# Patient Record
Sex: Male | Born: 1990 | Race: Black or African American | Hispanic: No | Marital: Married | Smoking: Never smoker
Health system: Southern US, Community
[De-identification: ages and names within clinical notes are randomized; demographics above are authoritative.]

---

## 2019-04-08 ENCOUNTER — Other Ambulatory Visit: Payer: Self-pay

## 2019-04-08 ENCOUNTER — Emergency Department (HOSPITAL_COMMUNITY)
Admission: EM | Admit: 2019-04-08 | Discharge: 2019-04-09 | Disposition: A | Discharge status: Home / Self Care | Payer: BC Managed Care – PPO | Attending: Emergency Medicine | Admitting: Emergency Medicine

## 2019-04-08 DIAGNOSIS — S5001XA Contusion of right elbow, initial encounter: Secondary | ICD-10-CM | POA: Insufficient documentation

## 2019-04-08 DIAGNOSIS — Y999 Unspecified external cause status: Secondary | ICD-10-CM | POA: Diagnosis not present

## 2019-04-08 DIAGNOSIS — S59901A Unspecified injury of right elbow, initial encounter: Secondary | ICD-10-CM | POA: Diagnosis not present

## 2019-04-08 DIAGNOSIS — Y9355 Activity, bike riding: Secondary | ICD-10-CM | POA: Insufficient documentation

## 2019-04-08 DIAGNOSIS — Y929 Unspecified place or not applicable: Secondary | ICD-10-CM | POA: Insufficient documentation

## 2019-04-08 DIAGNOSIS — M7989 Other specified soft tissue disorders: Secondary | ICD-10-CM | POA: Diagnosis not present

## 2019-04-08 DIAGNOSIS — M25521 Pain in right elbow: Secondary | ICD-10-CM | POA: Diagnosis not present

## 2019-04-09 ENCOUNTER — Other Ambulatory Visit: Payer: Self-pay

## 2019-04-09 ENCOUNTER — Emergency Department (HOSPITAL_COMMUNITY): Payer: BC Managed Care – PPO

## 2019-04-09 ENCOUNTER — Encounter (HOSPITAL_COMMUNITY): Payer: Self-pay | Admitting: *Deleted

## 2019-04-09 DIAGNOSIS — S5001XA Contusion of right elbow, initial encounter: Secondary | ICD-10-CM | POA: Diagnosis not present

## 2019-04-09 DIAGNOSIS — M25521 Pain in right elbow: Secondary | ICD-10-CM | POA: Diagnosis not present

## 2019-04-09 DIAGNOSIS — M7989 Other specified soft tissue disorders: Secondary | ICD-10-CM | POA: Diagnosis not present

## 2019-04-09 NOTE — ED Notes (Signed)
Patient verbalizes understanding of discharge instructions. Opportunity for questioning and answers were provided. Armband removed by staff, pt discharged from ED ambulatory to home.  

## 2019-04-09 NOTE — Discharge Instructions (Signed)
Apply ice to areas of injury 3-4 times per day to limit inflammation.  Avoid strenuous activity and heavy lifting.  We recommend consistent use of naproxen as prescribed.  We recommend follow-up with a primary care doctor to ensure resolution of symptoms.  You may follow-up with orthopedics if pain persists, if desired.  Return to the ED for any new or concerning symptoms.
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
## 2020-05-17 ENCOUNTER — Emergency Department (HOSPITAL_BASED_OUTPATIENT_CLINIC_OR_DEPARTMENT_OTHER): Payer: BC Managed Care – PPO

## 2020-05-17 ENCOUNTER — Encounter (HOSPITAL_BASED_OUTPATIENT_CLINIC_OR_DEPARTMENT_OTHER): Payer: Self-pay | Admitting: Emergency Medicine

## 2020-05-17 ENCOUNTER — Other Ambulatory Visit: Payer: Self-pay

## 2020-05-17 ENCOUNTER — Emergency Department (HOSPITAL_BASED_OUTPATIENT_CLINIC_OR_DEPARTMENT_OTHER)
Admission: EM | Admit: 2020-05-17 | Discharge: 2020-05-17 | Disposition: A | Discharge status: Home / Self Care | Payer: BC Managed Care – PPO | Attending: Emergency Medicine | Admitting: Emergency Medicine

## 2020-05-17 DIAGNOSIS — R079 Chest pain, unspecified: Secondary | ICD-10-CM | POA: Diagnosis not present

## 2020-05-17 DIAGNOSIS — M79645 Pain in left finger(s): Secondary | ICD-10-CM | POA: Diagnosis not present

## 2020-05-17 DIAGNOSIS — S01411A Laceration without foreign body of right cheek and temporomandibular area, initial encounter: Secondary | ICD-10-CM | POA: Insufficient documentation

## 2020-05-17 DIAGNOSIS — Z23 Encounter for immunization: Secondary | ICD-10-CM | POA: Insufficient documentation

## 2020-05-17 DIAGNOSIS — S0091XA Abrasion of unspecified part of head, initial encounter: Secondary | ICD-10-CM | POA: Diagnosis not present

## 2020-05-17 DIAGNOSIS — S40812A Abrasion of left upper arm, initial encounter: Secondary | ICD-10-CM | POA: Diagnosis not present

## 2020-05-17 DIAGNOSIS — S0990XA Unspecified injury of head, initial encounter: Secondary | ICD-10-CM | POA: Diagnosis not present

## 2020-05-17 DIAGNOSIS — S0181XA Laceration without foreign body of other part of head, initial encounter: Secondary | ICD-10-CM

## 2020-05-17 DIAGNOSIS — Z043 Encounter for examination and observation following other accident: Secondary | ICD-10-CM | POA: Diagnosis not present

## 2020-05-17 DIAGNOSIS — S40811A Abrasion of right upper arm, initial encounter: Secondary | ICD-10-CM | POA: Insufficient documentation

## 2020-05-17 DIAGNOSIS — S0081XA Abrasion of other part of head, initial encounter: Secondary | ICD-10-CM | POA: Diagnosis not present

## 2020-05-17 MED ORDER — CYCLOBENZAPRINE HCL 10 MG PO TABS
10.0000 mg | ORAL_TABLET | Freq: Once | ORAL | Status: AC
  Administered 2020-05-17: 10 mg via ORAL
  Filled 2020-05-17: qty 1

## 2020-05-17 MED ORDER — CYCLOBENZAPRINE HCL 10 MG PO TABS: 10.0000 mg | ORAL_TABLET | Freq: Three times a day (TID) | ORAL | 0 refills | Status: AC | PRN

## 2020-05-17 MED ORDER — ACETAMINOPHEN 500 MG PO TABS
1000.0000 mg | ORAL_TABLET | Freq: Once | ORAL | Status: AC
  Administered 2020-05-17: 1000 mg via ORAL
  Filled 2020-05-17: qty 2

## 2020-05-17 MED ORDER — TETANUS-DIPHTH-ACELL PERTUSSIS 5-2.5-18.5 LF-MCG/0.5 IM SUSY
0.5000 mL | PREFILLED_SYRINGE | Freq: Once | INTRAMUSCULAR | Status: AC
  Administered 2020-05-17: 0.5 mL via INTRAMUSCULAR
  Filled 2020-05-17: qty 0.5

## 2020-05-17 MED ORDER — LIDOCAINE HCL (PF) 1 % IJ SOLN
5.0000 mL | Freq: Once | INTRAMUSCULAR | Status: AC
  Administered 2020-05-17: 5 mL via INTRADERMAL
  Filled 2020-05-17: qty 5

## 2020-05-17 MED ORDER — NAPROXEN 500 MG PO TABS: 500.0000 mg | ORAL_TABLET | Freq: Two times a day (BID) | ORAL | 0 refills | Status: AC
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
## 2020-05-17 NOTE — ED Notes (Signed)
Pt given crackers and juice
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
## 2020-05-17 NOTE — Discharge Instructions (Signed)
I have placed 2 sutures to your right cheek, please have them removed within the next 5 to 7 days.  Please keep the wound away from the sun, you may apply bacitracin.   I have prescribed a short course of anti-inflammatories to help with the swelling, please take 1 tablet twice a day for the next 7 days.  In addition, you are prescribed muscle relaxers, please be aware this medication can make you drowsy.  You may take 1 tablet up to 3 times a day to help with muscle spasms.  You may also apply ice, with clean wounds while at home.

## 2020-05-23 DIAGNOSIS — S0081XA Abrasion of other part of head, initial encounter: Secondary | ICD-10-CM | POA: Diagnosis not present

## 2020-05-23 DIAGNOSIS — Z4802 Encounter for removal of sutures: Secondary | ICD-10-CM | POA: Diagnosis not present

## 2020-05-30 ENCOUNTER — Ambulatory Visit (HOSPITAL_BASED_OUTPATIENT_CLINIC_OR_DEPARTMENT_OTHER): Payer: BC Managed Care – PPO | Admitting: Family Medicine

## 2020-06-11 ENCOUNTER — Encounter (HOSPITAL_BASED_OUTPATIENT_CLINIC_OR_DEPARTMENT_OTHER): Payer: Self-pay | Admitting: Family Medicine

## 2020-06-11 ENCOUNTER — Other Ambulatory Visit: Payer: Self-pay

## 2020-06-11 ENCOUNTER — Ambulatory Visit (INDEPENDENT_AMBULATORY_CARE_PROVIDER_SITE_OTHER): Payer: BC Managed Care – PPO | Admitting: Family Medicine

## 2020-06-11 VITALS — BP 126/84 | HR 61 | Ht 69.0 in | Wt 193.2 lb

## 2020-06-11 DIAGNOSIS — Z6828 Body mass index (BMI) 28.0-28.9, adult: Secondary | ICD-10-CM | POA: Diagnosis not present

## 2020-06-11 DIAGNOSIS — Z13220 Encounter for screening for lipoid disorders: Secondary | ICD-10-CM

## 2020-06-11 NOTE — Progress Notes (Signed)
   New Patient Office Visit  Subjective:  Patient ID: Jeff Lawson, male    DOB: 06-18-1990  Age: 30 y.o. MRN: [id number]  CC:  Chief Complaint  Patient presents with  . Establish Care    Est Care - No prior Pcp    HPI Jeff Lawson is a 30 year old male presenting to establish in clinic.  He denies any acute concerns today.  Denies any significant past medical history.  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  He is also relatively active, particularly with cycling.  Indicates that he has not had any primary care provider locally.  He originally moved to the area from [place] for school and has remained here.  Denies any prior issues leading to hospitalization or surgery in the past.  History reviewed. No pertinent past medical history.  History reviewed. No pertinent surgical history.  Family History  Problem Relation Age of Onset  . Arthritis Mother   . Dementia Maternal Grandmother   . Diabetes Maternal Grandfather     Social History   Socioeconomic History  . Marital status: Married    Spouse name: Not on file  . Number of children: Not on file  . Years of education: Not on file  . Highest education level: Not on file  Occupational History  . Not on file  Tobacco Use  . Smoking status: Never Smoker  . Smokeless tobacco: Never Used  Vaping Use  . Vaping Use: Never used  Substance and Sexual Activity  . Alcohol use: Never  . Drug use: Never  . Sexual activity: Not Currently  Other Topics Concern  . Not on file  Social History Narrative  . Not on file   Social Determinants of Health   Financial Resource Strain: Not on file  Food Insecurity: Not on file  Transportation Needs: Not on file  Physical Activity: Not on file  Stress: Not on file  Social Connections: Not on file  Intimate Partner Violence: Not on file    Objective:   Today's Vitals: BP 126/84   Pulse 61   Ht 5\' 9"   (1.753 m)   Wt 193 lb 3.2 oz (87.6 kg)   SpO2 98%   BMI 28.53 kg/m   Physical Exam  Pleasant 30 year old male in no acute distress Cardiovascular exam with regular rate and rhythm, no murmurs appreciated Lungs clear to auscultation bilaterally  Assessment & Plan:   Problem List Items Addressed This Visit      Other   BMI 28.0-28.9,adult    Patient is generally active, particularly engages in regular cycling Encouraged to continue with regular physical activity, aiming for at least 250 minutes of moderate intensity aerobic activity per week Can continue to monitor weight in the future Check labs as below      Relevant Orders   CBC with Differential/Platelet   Comprehensive metabolic panel    Other Visit Diagnoses    Screening for cholesterol level    -  Primary   Relevant Orders   CBC with Differential/Platelet   Comprehensive metabolic panel   Lipid panel      No outpatient encounter medications on file as of 06/11/2020.   No facility-administered encounter medications on file as of 06/11/2020.    Follow-up: Return in about 2 months (around 08/11/2020) for Follow Up.  Plan to complete CPE at next visit.  Asser Lucena J De 10/11/2020, MD

## 2020-06-11 NOTE — Patient Instructions (Signed)
  Medication Instructions:  Your physician recommends that you continue on your current medications as directed. Please refer to the Current Medication list given to you today. --If you need a refill on any your medications before your next appointment, please call your pharmacy first. If no refills are authorized on file call the office.--  Lab Work: Your physician has recommended that you have lab work today: CBC, CMP, and Lipid Profile If you have labs (blood work) drawn today and your tests are completely normal, you will receive your results only by: Marland Kitchen MyChart Message (if you have MyChart) OR . A phone call from our staff. Please ensure you check your voicemail in the event that you authorized detailed messages to be left on a delegated number. If you have any lab test that is abnormal or we need to change your treatment, we will call you to review the results.  Follow-Up: Your next appointment:   Your physician recommends that you schedule a follow-up appointment in: 2 MONTHS with Dr. de Peru  Thanks for letting us be apart of your health journey!!  Primary Care and Sports Medicine   Dr. de Peru and Shawna Clamp, DNP, AGNP  We recommend signing up for the patient portal called "MyChart".  Sign up information is provided on this After Visit Summary.  MyChart is used to connect with patients for Virtual Visits (Telemedicine).  Patients are able to view lab/test results, encounter notes, upcoming appointments, etc.  Non-urgent messages can be sent to your provider as well.   To learn more about what you can do with MyChart, please visit --  [email or web address].

## 2020-06-11 NOTE — Assessment & Plan Note (Signed)
Patient is generally active, particularly engages in regular cycling Encouraged to continue with regular physical activity, aiming for at least 250 minutes of moderate intensity aerobic activity per week Can continue to monitor weight in the future Check labs as below

## 2020-06-12 LAB — COMPREHENSIVE METABOLIC PANEL
ALT: 20 IU/L (ref 0–44)
AST: 28 IU/L (ref 0–40)
Albumin/Globulin Ratio: 1.7 (ref 1.2–2.2)
Albumin: 4.8 g/dL (ref 4.0–5.0)
Alkaline Phosphatase: 81 IU/L (ref 44–121)
BUN/Creatinine Ratio: 13 (ref 9–20)
BUN: 14 mg/dL (ref 6–20)
Bilirubin Total: 1.7 mg/dL — ABNORMAL HIGH (ref 0.0–1.2)
CO2: 23 mmol/L (ref 20–29)
Calcium: 9.6 mg/dL (ref 8.7–10.2)
Chloride: 98 mmol/L (ref 96–106)
Creatinine, Ser: 1.07 mg/dL (ref 0.76–1.27)
Globulin, Total: 2.8 g/dL (ref 1.5–4.5)
Glucose: 73 mg/dL (ref 65–99)
Potassium: 4.1 mmol/L (ref 3.5–5.2)
Sodium: 138 mmol/L (ref 134–144)
Total Protein: 7.6 g/dL (ref 6.0–8.5)
eGFR: 95 mL/min/{1.73_m2} (ref >59)

## 2020-06-12 LAB — CBC WITH DIFFERENTIAL/PLATELET
Basophils Absolute: 0.1 10*3/uL (ref 0.0–0.2)
Basos: 1 %
EOS (ABSOLUTE): 0.4 10*3/uL (ref 0.0–0.4)
Eos: 6 %
Hematocrit: 40.7 % (ref 37.5–51.0)
Hemoglobin: 13.8 g/dL (ref 13.0–17.7)
Immature Grans (Abs): 0 10*3/uL (ref 0.0–0.1)
Immature Granulocytes: 0 %
Lymphocytes Absolute: 3.4 10*3/uL — ABNORMAL HIGH (ref 0.7–3.1)
Lymphs: 49 %
MCH: 26.8 pg (ref 26.6–33.0)
MCHC: 33.9 g/dL (ref 31.5–35.7)
MCV: 79 fL (ref 79–97)
Monocytes Absolute: 0.8 10*3/uL (ref 0.1–0.9)
Monocytes: 11 %
Neutrophils Absolute: 2.3 10*3/uL (ref 1.4–7.0)
Neutrophils: 33 %
Platelets: 273 10*3/uL (ref 150–450)
RBC: 5.15 x10E6/uL (ref 4.14–5.80)
RDW: 13 % (ref 11.6–15.4)
WBC: 6.9 10*3/uL (ref 3.4–10.8)

## 2020-06-12 LAB — LIPID PANEL
Chol/HDL Ratio: 3.1 ratio (ref 0.0–5.0)
Cholesterol, Total: 169 mg/dL (ref 100–199)
HDL: 55 mg/dL (ref >39)
LDL Chol Calc (NIH): 98 mg/dL (ref 0–99)
Triglycerides: 86 mg/dL (ref 0–149)
VLDL Cholesterol Cal: 16 mg/dL (ref 5–40)

## 2020-06-17 ENCOUNTER — Telehealth (HOSPITAL_BASED_OUTPATIENT_CLINIC_OR_DEPARTMENT_OTHER): Payer: Self-pay

## 2020-06-17 NOTE — Telephone Encounter (Signed)
Results released by Dr. de [place] and reviewed by patient via MyChart Instructed patient to contact the office with any questions or concerns.  

## 2020-06-17 NOTE — Telephone Encounter (Signed)
-----   Message from Hosie Poisson Peru, MD sent at 06/16/2020  4:59 PM EDT ----- Cliffton Asters blood cell and red blood cell counts are normal with normal hemoglobin.  Electrolytes, kidney function and liver function are normal.  Lipid panel is good with normal cholesterol values.

## 2020-07-17 ENCOUNTER — Emergency Department (HOSPITAL_COMMUNITY): Payer: BC Managed Care – PPO

## 2020-07-17 ENCOUNTER — Other Ambulatory Visit: Payer: Self-pay

## 2020-07-17 ENCOUNTER — Emergency Department (HOSPITAL_COMMUNITY)
Admission: EM | Admit: 2020-07-17 | Discharge: 2020-07-17 | Disposition: A | Discharge status: Home / Self Care | Payer: BC Managed Care – PPO | Attending: Emergency Medicine | Admitting: Emergency Medicine

## 2020-07-17 DIAGNOSIS — S8011XA Contusion of right lower leg, initial encounter: Secondary | ICD-10-CM | POA: Diagnosis not present

## 2020-07-17 DIAGNOSIS — S3991XA Unspecified injury of abdomen, initial encounter: Secondary | ICD-10-CM | POA: Diagnosis not present

## 2020-07-17 DIAGNOSIS — S199XXA Unspecified injury of neck, initial encounter: Secondary | ICD-10-CM | POA: Diagnosis not present

## 2020-07-17 DIAGNOSIS — T148XXA Other injury of unspecified body region, initial encounter: Secondary | ICD-10-CM

## 2020-07-17 DIAGNOSIS — I959 Hypotension, unspecified: Secondary | ICD-10-CM | POA: Diagnosis not present

## 2020-07-17 DIAGNOSIS — Z041 Encounter for examination and observation following transport accident: Secondary | ICD-10-CM | POA: Diagnosis not present

## 2020-07-17 DIAGNOSIS — R0902 Hypoxemia: Secondary | ICD-10-CM | POA: Diagnosis not present

## 2020-07-17 DIAGNOSIS — S0990XA Unspecified injury of head, initial encounter: Secondary | ICD-10-CM | POA: Diagnosis not present

## 2020-07-17 DIAGNOSIS — R609 Edema, unspecified: Secondary | ICD-10-CM

## 2020-07-17 DIAGNOSIS — S8991XA Unspecified injury of right lower leg, initial encounter: Secondary | ICD-10-CM | POA: Diagnosis not present

## 2020-07-17 DIAGNOSIS — K760 Fatty (change of) liver, not elsewhere classified: Secondary | ICD-10-CM | POA: Diagnosis not present

## 2020-07-17 DIAGNOSIS — S79921A Unspecified injury of right thigh, initial encounter: Secondary | ICD-10-CM | POA: Diagnosis not present

## 2020-07-17 DIAGNOSIS — Y92410 Unspecified street and highway as the place of occurrence of the external cause: Secondary | ICD-10-CM | POA: Diagnosis not present

## 2020-07-17 DIAGNOSIS — R42 Dizziness and giddiness: Secondary | ICD-10-CM | POA: Diagnosis not present

## 2020-07-17 DIAGNOSIS — N281 Cyst of kidney, acquired: Secondary | ICD-10-CM | POA: Diagnosis not present

## 2020-07-17 DIAGNOSIS — S79929A Unspecified injury of unspecified thigh, initial encounter: Secondary | ICD-10-CM | POA: Diagnosis not present

## 2020-07-17 DIAGNOSIS — T1490XA Injury, unspecified, initial encounter: Secondary | ICD-10-CM

## 2020-07-17 DIAGNOSIS — S7011XA Contusion of right thigh, initial encounter: Secondary | ICD-10-CM | POA: Diagnosis not present

## 2020-07-17 DIAGNOSIS — S299XXA Unspecified injury of thorax, initial encounter: Secondary | ICD-10-CM | POA: Diagnosis not present

## 2020-07-17 DIAGNOSIS — M79604 Pain in right leg: Secondary | ICD-10-CM | POA: Diagnosis not present

## 2020-07-17 LAB — CBC WITH DIFFERENTIAL/PLATELET
Abs Immature Granulocytes: 0.11 10*3/uL — ABNORMAL HIGH (ref 0.00–0.07)
Basophils Absolute: 0.1 10*3/uL (ref 0.0–0.1)
Basophils Relative: 0 %
Eosinophils Absolute: 0.1 10*3/uL (ref 0.0–0.5)
Eosinophils Relative: 1 %
HCT: 43.7 % (ref 39.0–52.0)
Hemoglobin: 13.8 g/dL (ref 13.0–17.0)
Immature Granulocytes: 1 %
Lymphocytes Relative: 19 %
Lymphs Abs: 4.1 10*3/uL — ABNORMAL HIGH (ref 0.7–4.0)
MCH: 26.9 pg (ref 26.0–34.0)
MCHC: 31.6 g/dL (ref 30.0–36.0)
MCV: 85.2 fL (ref 80.0–100.0)
Monocytes Absolute: 1.2 10*3/uL — ABNORMAL HIGH (ref 0.1–1.0)
Monocytes Relative: 6 %
Neutro Abs: 16.2 10*3/uL — ABNORMAL HIGH (ref 1.7–7.7)
Neutrophils Relative %: 73 %
Platelets: 269 10*3/uL (ref 150–400)
RBC: 5.13 MIL/uL (ref 4.22–5.81)
RDW: 13 % (ref 11.5–15.5)
WBC: 21.7 10*3/uL — ABNORMAL HIGH (ref 4.0–10.5)
nRBC: 0 % (ref 0.0–0.2)

## 2020-07-17 LAB — COMPREHENSIVE METABOLIC PANEL
ALT: 28 U/L (ref 0–44)
AST: 48 U/L — ABNORMAL HIGH (ref 15–41)
Albumin: 4.1 g/dL (ref 3.5–5.0)
Alkaline Phosphatase: 62 U/L (ref 38–126)
Anion gap: 13 (ref 5–15)
BUN: 19 mg/dL (ref 6–20)
CO2: 23 mmol/L (ref 22–32)
Calcium: 9.4 mg/dL (ref 8.9–10.3)
Chloride: 103 mmol/L (ref 98–111)
Creatinine, Ser: 1.72 mg/dL — ABNORMAL HIGH (ref 0.61–1.24)
GFR, Estimated: 53 mL/min — ABNORMAL LOW (ref >60)
Glucose, Bld: 175 mg/dL — ABNORMAL HIGH (ref 70–99)
Potassium: 4.1 mmol/L (ref 3.5–5.1)
Sodium: 139 mmol/L (ref 135–145)
Total Bilirubin: 1.7 mg/dL — ABNORMAL HIGH (ref 0.3–1.2)
Total Protein: 6.9 g/dL (ref 6.5–8.1)

## 2020-07-17 LAB — TYPE AND SCREEN
ABO/RH(D): O POS
Antibody Screen: NEGATIVE

## 2020-07-17 MED ORDER — FENTANYL CITRATE (PF) 100 MCG/2ML IJ SOLN
100.0000 ug | Freq: Once | INTRAMUSCULAR | Status: AC
  Administered 2020-07-17: 100 ug via INTRAVENOUS
  Filled 2020-07-17: qty 2

## 2020-07-17 MED ORDER — IOHEXOL 350 MG/ML SOLN
50.0000 mL | Freq: Once | INTRAVENOUS | Status: AC | PRN
  Administered 2020-07-17: 50 mL via INTRAVENOUS

## 2020-07-17 MED ORDER — FENTANYL CITRATE (PF) 100 MCG/2ML IJ SOLN
100.0000 ug | Freq: Once | INTRAMUSCULAR | Status: AC
  Administered 2020-07-17: 100 ug via INTRAVENOUS

## 2020-07-17 MED ORDER — SODIUM CHLORIDE 0.9 % IV BOLUS
1000.0000 mL | Freq: Once | INTRAVENOUS | Status: AC
  Administered 2020-07-17: 1000 mL via INTRAVENOUS

## 2020-07-17 MED ORDER — IOHEXOL 300 MG/ML  SOLN
50.0000 mL | Freq: Once | INTRAMUSCULAR | Status: AC | PRN
  Administered 2020-07-17: 50 mL via INTRAVENOUS

## 2020-07-17 MED ORDER — NAPROXEN 500 MG PO TABS: 500.0000 mg | ORAL_TABLET | Freq: Two times a day (BID) | ORAL | 0 refills | Status: DC

## 2020-07-17 NOTE — ED Notes (Signed)
Patient discharge paperwork reviewed with the patient. The patient verbalized understanding of instructions. Patient discharged. 
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
## 2020-07-17 NOTE — ED Notes (Signed)
C-collar removed by EDP.   
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
## 2020-07-17 NOTE — ED Notes (Signed)
Patient BP dropped into 70s while in CT, RN and TRN @ bedside, EDP made aware, initiated NS bolus.

## 2020-07-17 NOTE — ED Notes (Signed)
Patient transported to CT 

## 2020-07-17 NOTE — Discharge Instructions (Addendum)
Thankfully your x-rays and CAT scans do not show any signs of serious internal injury however they do show that you have some significant bruising and bleeding within the muscles of your right thigh.  This should gradually get better over the next month however I would avoid any intense exercise until you are significantly improved and pain-free.  At this time I would recommend bed rest for 24 hours, ice, anti-inflammatories as I have prescribed  Please take Naprosyn, 500mg  by mouth twice daily as needed for pain - this in an antiinflammatory medicine (NSAID) and is similar to ibuprofen - many people feel that it is stronger than ibuprofen and it is easier to take since it is a smaller pill.  Please use this only for 1 week - if your pain persists, you will need to follow up with your doctor in the office for ongoing guidance and pain control.    Once you have become significantly improved please start to walk and gently exercise, you may return to full exercise once your pain has subsided.  Please return to the emergency department for numbness weakness or severe worsening symptoms.  You are likely anemic because you have lost some blood into your leg, able to take you a couple of weeks to make this back so please hydrate aggressively.  Thank you for letting take care of you today!  Please obtain all of your results from medical records or have your doctors office obtain the results - share them with your doctor - you should be seen at your doctors office in the next 2 days. Call today to arrange your follow up. Take the medications as prescribed. Please review all of the medicines and only take them if you do not have an allergy to them. Please be aware that if you are taking birth control pills, taking other prescriptions, ESPECIALLY ANTIBIOTICS may make the birth control ineffective - if this is the case, either do not engage in sexual activity or use alternative methods of birth control such as  condoms until you have finished the medicine and your family doctor says it is OK to restart them. If you are on a blood thinner such as COUMADIN, be aware that any other medicine that you take may cause the coumadin to either work too much, or not enough - you should have your coumadin level rechecked in next 7 days if this is the case.  ?  It is also a possibility that you have an allergic reaction to any of the medicines that you have been prescribed - Everybody reacts differently to medications and while MOST people have no trouble with most medicines, you may have a reaction such as nausea, vomiting, rash, swelling, shortness of breath. If this is the case, please stop taking the medicine immediately and contact your physician.   If you were given a medication in the ED such as percocet, vicodin, or morphine, be aware that these medicines are sedating and may change your ability to take care of yourself adequately for several hours after being given this medicines - you should not drive or take care of small children if you were given this medicine in the Emergency Department or if you have been prescribed these types of medicines. ?   You should return to the ER IMMEDIATELY if you develop severe or worsening symptoms.
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
## 2020-08-06 ENCOUNTER — Telehealth (HOSPITAL_BASED_OUTPATIENT_CLINIC_OR_DEPARTMENT_OTHER): Payer: Self-pay | Admitting: Family Medicine

## 2020-08-06 NOTE — Telephone Encounter (Signed)
Responded to MyChart message - Called Pt to confirm 9/6 CPE appt for 2:30p

## 2020-08-11 ENCOUNTER — Encounter (HOSPITAL_BASED_OUTPATIENT_CLINIC_OR_DEPARTMENT_OTHER): Payer: BC Managed Care – PPO | Admitting: Family Medicine

## 2020-09-09 ENCOUNTER — Ambulatory Visit (HOSPITAL_BASED_OUTPATIENT_CLINIC_OR_DEPARTMENT_OTHER): Payer: BC Managed Care – PPO | Admitting: Family Medicine

## 2020-09-09 ENCOUNTER — Ambulatory Visit (INDEPENDENT_AMBULATORY_CARE_PROVIDER_SITE_OTHER): Payer: BC Managed Care – PPO | Admitting: Family Medicine

## 2020-09-09 ENCOUNTER — Other Ambulatory Visit: Payer: Self-pay

## 2020-09-09 ENCOUNTER — Encounter (HOSPITAL_BASED_OUTPATIENT_CLINIC_OR_DEPARTMENT_OTHER): Payer: Self-pay | Admitting: Family Medicine

## 2020-09-09 DIAGNOSIS — R7989 Other specified abnormal findings of blood chemistry: Secondary | ICD-10-CM | POA: Insufficient documentation

## 2020-09-09 DIAGNOSIS — D72829 Elevated white blood cell count, unspecified: Secondary | ICD-10-CM | POA: Insufficient documentation

## 2020-09-09 DIAGNOSIS — Z0000 Encounter for general adult medical examination without abnormal findings: Secondary | ICD-10-CM

## 2020-09-09 DIAGNOSIS — R7401 Elevation of levels of liver transaminase levels: Secondary | ICD-10-CM | POA: Diagnosis not present

## 2020-09-09 NOTE — Assessment & Plan Note (Signed)
Overall, well-appearing male Discussed general screening recommendations, thinks that he might of had HIV and hepatitis C testing in the past in regards to screening, declines any screening related to this today Encouraged to continue with regular physical activity Utilize sun protection when outdoors

## 2020-09-09 NOTE — Assessment & Plan Note (Signed)
Repeat labs today to assess status

## 2020-09-09 NOTE — Assessment & Plan Note (Signed)
Repeat labs today to assess status 
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
## 2020-09-09 NOTE — Patient Instructions (Signed)
  Medication Instructions:  Your physician recommends that you continue on your current medications as directed. Please refer to the Current Medication list given to you today. --If you need a refill on any your medications before your next appointment, please call your pharmacy first. If no refills are authorized on file call the office.--  Lab Work: Your physician has recommended that you have lab work today: CBC and CMP If you have labs (blood work) drawn today and your tests are completely normal, you will receive your results via MyChart message OR a phone call from our staff.  Please ensure you check your voicemail in the event that you authorized detailed messages to be left on a delegated number. If you have any lab test that is abnormal or we need to change your treatment, we will call you to review the results.  Follow-Up: Your next appointment:   Your physician recommends that you schedule a follow-up appointment in: 1 YEAR with Dr. de [place]  Thanks for letting us be apart of your health journey!!  Primary Care and Sports Medicine   Dr. Ceasar Mons [place]   We encourage you to activate your patient portal called "MyChart".  Sign up information is provided on this After Visit Summary.  MyChart is used to connect with patients for Virtual Visits (Telemedicine).  Patients are able to view lab/test results, encounter notes, upcoming appointments, etc.  Non-urgent messages can be sent to your provider as well. To learn more about what you can do with MyChart, please visit --  [email or web address].

## 2020-09-10 LAB — CBC WITH DIFFERENTIAL/PLATELET
Basophils Absolute: 0.1 10*3/uL (ref 0.0–0.2)
Basos: 1 %
EOS (ABSOLUTE): 0.3 10*3/uL (ref 0.0–0.4)
Eos: 5 %
Hematocrit: 41.5 % (ref 37.5–51.0)
Hemoglobin: 13.7 g/dL (ref 13.0–17.7)
Immature Grans (Abs): 0 10*3/uL (ref 0.0–0.1)
Immature Granulocytes: 0 %
Lymphocytes Absolute: 3.1 10*3/uL (ref 0.7–3.1)
Lymphs: 48 %
MCH: 26.5 pg — ABNORMAL LOW (ref 26.6–33.0)
MCHC: 33 g/dL (ref 31.5–35.7)
MCV: 80 fL (ref 79–97)
Monocytes Absolute: 0.5 10*3/uL (ref 0.1–0.9)
Monocytes: 8 %
Neutrophils Absolute: 2.5 10*3/uL (ref 1.4–7.0)
Neutrophils: 38 %
Platelets: 270 10*3/uL (ref 150–450)
RBC: 5.17 x10E6/uL (ref 4.14–5.80)
RDW: 12.8 % (ref 11.6–15.4)
WBC: 6.5 10*3/uL (ref 3.4–10.8)

## 2020-09-10 LAB — COMPREHENSIVE METABOLIC PANEL
ALT: 17 IU/L (ref 0–44)
AST: 27 IU/L (ref 0–40)
Albumin/Globulin Ratio: 1.8 (ref 1.2–2.2)
Albumin: 4.8 g/dL (ref 4.0–5.0)
Alkaline Phosphatase: 83 IU/L (ref 44–121)
BUN/Creatinine Ratio: 9 (ref 9–20)
BUN: 10 mg/dL (ref 6–20)
Bilirubin Total: 0.9 mg/dL (ref 0.0–1.2)
CO2: 27 mmol/L (ref 20–29)
Calcium: 10 mg/dL (ref 8.7–10.2)
Chloride: 102 mmol/L (ref 96–106)
Creatinine, Ser: 1.06 mg/dL (ref 0.76–1.27)
Globulin, Total: 2.6 g/dL (ref 1.5–4.5)
Glucose: 84 mg/dL (ref 65–99)
Potassium: 4.5 mmol/L (ref 3.5–5.2)
Sodium: 140 mmol/L (ref 134–144)
Total Protein: 7.4 g/dL (ref 6.0–8.5)
eGFR: 96 mL/min/{1.73_m2} (ref >59)

## 2020-09-17 ENCOUNTER — Encounter (HOSPITAL_BASED_OUTPATIENT_CLINIC_OR_DEPARTMENT_OTHER): Payer: BC Managed Care – PPO | Admitting: Family Medicine
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
## 2023-08-02 ENCOUNTER — Encounter: Payer: Self-pay | Admitting: Family Medicine

## 2023-08-09 ENCOUNTER — Ambulatory Visit (INDEPENDENT_AMBULATORY_CARE_PROVIDER_SITE_OTHER)

## 2023-08-09 VITALS — BP 133/83 | HR 55 | Temp 98.3°F | Resp 16 | Ht 68.0 in | Wt 191.8 lb

## 2023-08-09 DIAGNOSIS — S43012D Anterior subluxation of left humerus, subsequent encounter: Secondary | ICD-10-CM

## 2023-08-09 DIAGNOSIS — Z114 Encounter for screening for human immunodeficiency virus [HIV]: Secondary | ICD-10-CM | POA: Diagnosis not present

## 2023-08-09 DIAGNOSIS — Z1159 Encounter for screening for other viral diseases: Secondary | ICD-10-CM

## 2023-08-09 DIAGNOSIS — Z7689 Persons encountering health services in other specified circumstances: Secondary | ICD-10-CM

## 2023-08-09 DIAGNOSIS — Z0000 Encounter for general adult medical examination without abnormal findings: Secondary | ICD-10-CM | POA: Diagnosis not present
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
## 2023-08-10 ENCOUNTER — Ambulatory Visit: Payer: Self-pay

## 2023-08-10 LAB — COMPREHENSIVE METABOLIC PANEL WITH GFR
ALT: 19 IU/L (ref 0–44)
AST: 27 IU/L (ref 0–40)
Albumin: 4.7 g/dL (ref 4.1–5.1)
Alkaline Phosphatase: 83 IU/L (ref 44–121)
BUN/Creatinine Ratio: 9 (ref 9–20)
BUN: 10 mg/dL (ref 6–20)
Bilirubin Total: 1.6 mg/dL — ABNORMAL HIGH (ref 0.0–1.2)
CO2: 22 mmol/L (ref 20–29)
Calcium: 9.5 mg/dL (ref 8.7–10.2)
Chloride: 103 mmol/L (ref 96–106)
Creatinine, Ser: 1.06 mg/dL (ref 0.76–1.27)
Globulin, Total: 2.5 g/dL (ref 1.5–4.5)
Glucose: 85 mg/dL (ref 70–99)
Potassium: 4.3 mmol/L (ref 3.5–5.2)
Sodium: 141 mmol/L (ref 134–144)
Total Protein: 7.2 g/dL (ref 6.0–8.5)
eGFR: 94 mL/min/1.73 (ref >59)

## 2023-08-10 LAB — CBC
Hematocrit: 41.9 % (ref 37.5–51.0)
Hemoglobin: 13.6 g/dL (ref 13.0–17.7)
MCH: 27.3 pg (ref 26.6–33.0)
MCHC: 32.5 g/dL (ref 31.5–35.7)
MCV: 84 fL (ref 79–97)
Platelets: 260 x10E3/uL (ref 150–450)
RBC: 4.99 x10E6/uL (ref 4.14–5.80)
RDW: 12.8 % (ref 11.6–15.4)
WBC: 5.5 x10E3/uL (ref 3.4–10.8)

## 2023-08-10 LAB — HEPATITIS C ANTIBODY: Hep C Virus Ab: NONREACTIVE

## 2023-08-10 LAB — HEMOGLOBIN A1C
Est. average glucose Bld gHb Est-mCnc: 94 mg/dL
Hgb A1c MFr Bld: 4.9 % (ref 4.8–5.6)

## 2023-08-10 LAB — LIPID PANEL
Chol/HDL Ratio: 2.9 ratio (ref 0.0–5.0)
Cholesterol, Total: 154 mg/dL (ref 100–199)
HDL: 53 mg/dL (ref >39)
LDL Chol Calc (NIH): 85 mg/dL (ref 0–99)
Triglycerides: 86 mg/dL (ref 0–149)
VLDL Cholesterol Cal: 16 mg/dL (ref 5–40)

## 2023-08-10 LAB — HIV ANTIBODY (ROUTINE TESTING W REFLEX): HIV Screen 4th Generation wRfx: NONREACTIVE

## 2023-08-10 NOTE — Progress Notes (Signed)
 Good news! All your lab results are in the normal range. Keep up the good work! Take care!

## 2023-08-19 ENCOUNTER — Encounter: Payer: Self-pay | Admitting: Surgical

## 2023-08-19 ENCOUNTER — Ambulatory Visit (INDEPENDENT_AMBULATORY_CARE_PROVIDER_SITE_OTHER): Admitting: Surgical

## 2023-08-19 ENCOUNTER — Other Ambulatory Visit: Payer: Self-pay

## 2023-08-19 DIAGNOSIS — M25512 Pain in left shoulder: Secondary | ICD-10-CM | POA: Diagnosis not present

## 2023-08-19 DIAGNOSIS — G8929 Other chronic pain: Secondary | ICD-10-CM | POA: Diagnosis not present
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
## 2023-08-31 ENCOUNTER — Ambulatory Visit
Admission: RE | Admit: 2023-08-31 | Discharge: 2023-08-31 | Disposition: A | Discharge status: Home / Self Care | Source: Ambulatory Visit | Attending: Surgical | Admitting: Surgical

## 2023-08-31 DIAGNOSIS — G8929 Other chronic pain: Secondary | ICD-10-CM

## 2023-08-31 DIAGNOSIS — S46012A Strain of muscle(s) and tendon(s) of the rotator cuff of left shoulder, initial encounter: Secondary | ICD-10-CM | POA: Diagnosis not present

## 2023-08-31 DIAGNOSIS — S43005A Unspecified dislocation of left shoulder joint, initial encounter: Secondary | ICD-10-CM | POA: Diagnosis not present

## 2023-08-31 MED ORDER — IOPAMIDOL (ISOVUE-M 200) INJECTION 41%
10.0000 mL | Freq: Once | INTRAMUSCULAR | Status: AC
  Administered 2023-08-31: 10 mL via INTRA_ARTICULAR

## 2023-09-16 ENCOUNTER — Encounter: Payer: Self-pay | Admitting: Surgical

## 2023-09-16 ENCOUNTER — Ambulatory Visit: Admitting: Surgical

## 2023-09-16 DIAGNOSIS — Z8739 Personal history of other diseases of the musculoskeletal system and connective tissue: Secondary | ICD-10-CM | POA: Diagnosis not present

## 2023-09-16 NOTE — Progress Notes (Signed)
 Office Visit Note   Patient: Jeff Lawson           Date of Birth: 06/24/1990           MRN: [id number] Visit Date: 09/16/2023 Requested by: Leavy Lucas Fox, PA-C [address] [place],  [place] [address] PCP: Leavy Lucas, Fox, PA-C  Subjective: No chief complaint on file.   HPI: Jeff Lawson is a 33 y.o. male who presents to the office for MRI review. Patient denies any changes in symptoms.  Continues to complain mainly of shoulder apprehension.  Continues to have minimal pain or discomfort in the shoulder.  He is more concerned about future dislocation events.  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  MRI results revealed: MR Shoulder Left w/ contrast Result Date: 09/05/2023 CLINICAL DATA:  Left shoulder pain since 2007 EXAM: MRI OF THE LEFT SHOULDER WITH CONTRAST TECHNIQUE: Multiplanar, multisequence MR imaging of the left shoulder was performed following the administration of intra-articular contrast. CONTRAST:  See Injection Documentation. COMPARISON:  None Available. FINDINGS: Rotator cuff: Mild supraspinatus and infraspinatus tendinosis. Teres minor tendon is intact. Subscapularis tendon is intact. Muscles: No muscle atrophy or edema. No intramuscular fluid collection or hematoma. Biceps Long Head: Intraarticular and extraarticular portions of the biceps tendon are intact. Acromioclavicular Joint: Mild arthropathy of the acromioclavicular joint. No subacromial/subdeltoid bursal fluid. Glenohumeral Joint: Intraarticular contrast distending the joint capsule. Normal glenohumeral ligaments. No chondral defect. Patulous joint capsule. 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 Bones: No fracture or dislocation. No aggressive osseous lesion. Subtle flattening of the anteromedial humeral head as can be seen with an old reverse Hill-Sachs deformity. Other: No fluid collection or hematoma. IMPRESSION: 1. 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 Subtle flattening of the anteromedial humeral head as can be seen with an old reverse Hill-Sachs deformity. 2. Mild supraspinatus and infraspinatus tendinosis. Electronically Signed   By: Julaine Blanch M.D.   On: 09/05/2023 07:44                 ROS: All systems reviewed are negative as they relate to the chief complaint within the history of present illness.  Patient denies fevers or chills.  Assessment & Plan: Visit Diagnoses:  1. History of dislocation of shoulder     Plan: Jeff Lawson is a 33 y.o. male who presents to the office for review of left shoulder MRI.  MRI demonstrates posterior labral tear extending up to the superior labrum with small reverse Hill-Sachs lesion.  He has demonstrable laxity and apprehension with posterior directed force on exam today.  Also has some early chondral wear in the posterior aspect of the glenoid consistent with his history of posterior dislocations.  We discussed options available to patient which mainly at this point include living with his symptoms versus physical therapy to optimize rotator cuff strength and hopefully prevent future dislocation episodes versus arthroscopic labral repair and this would likely involve bicep tenodesis at the time as well based on the presence of superior labral pathology.  The procedure was discussed with the patient as well as the recovery timeframe.  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  He wants to hold off on surgery for now but will discuss further with his wife and reach out to the office to let us  know what  he would like to do.  Follow-Up Instructions: No follow-ups on file.   Orders:  No orders of the defined types were placed in this encounter.  No orders of the defined types were placed in this encounter.     Procedures: No procedures performed   Clinical Data: No  additional findings.  Objective: Vital Signs: There were no vitals taken for this visit.  Physical Exam:  Constitutional: Patient appears well-developed HEENT:  Head: Normocephalic Eyes:EOM are normal Neck: Normal range of motion Cardiovascular: Normal rate Pulmonary/chest: Effort normal Neurologic: Patient is alert Skin: Skin is warm Psychiatric: Patient has normal mood and affect  Ortho Exam: Ortho exam demonstrates left shoulder with no change in range of motion compared with prior exam.  He does have intact rotator cuff strength of supra, infra, subscap.  Grade 1 sulcus sign noted.  Apprehension present with posterior directed force with increased laxity posteriorly compared with contralateral side.  There is very slightly increased laxity anteriorly but no such apprehension.  Axillary nerve intact with deltoid firing.  Intact EPL, FPL, finger abduction.  2+ radial pulse of the left upper extremity.  Specialty Comments:  No specialty comments available.  Imaging: No results found.   PMFS History: Patient Active Problem List   Diagnosis Date Noted   Annual physical exam 09/09/2020   Transaminitis 09/09/2020   Elevated serum creatinine 09/09/2020   Leukocytosis 09/09/2020   BMI 28.0-28.9,adult 06/11/2020   History reviewed. No pertinent past medical history.  Family History  Problem Relation Age of Onset   Arthritis Mother    Dementia Maternal Grandmother    Diabetes Maternal Grandfather     History reviewed. No pertinent surgical history. Social History   Occupational History   Not on file  Tobacco Use   Smoking status: Never   Smokeless tobacco: Never  Vaping Use   Vaping status: Never Used  Substance and Sexual Activity   Alcohol use: Never   Drug use: Never   Sexual activity: Not Currently

## 2023-09-19 ENCOUNTER — Ambulatory Visit: Payer: Self-pay | Admitting: Surgical

## 2023-11-07 ENCOUNTER — Encounter: Payer: Self-pay | Admitting: Radiology

## 2023-11-22 DIAGNOSIS — Z9852 Vasectomy status: Secondary | ICD-10-CM | POA: Diagnosis not present

## 2023-11-28 ENCOUNTER — Encounter: Admitting: Urology

## 2024-02-09 ENCOUNTER — Ambulatory Visit

## 2024-02-09 VITALS — BP 136/81 | HR 63 | Temp 98.1°F | Resp 17 | Ht 68.0 in | Wt 202.8 lb

## 2024-02-09 DIAGNOSIS — S43012D Anterior subluxation of left humerus, subsequent encounter: Secondary | ICD-10-CM

## 2024-02-09 NOTE — Progress Notes (Signed)
" ° ° ° °  Patient ID: Jeff Lawson, male    DOB: [birth year]  MRN: [id number]  CC: Medical Management of Chronic Issues (No concerns, 6 month f/u)   Subjective: Jeff Lawson is a 34 y.o. male who presents to clinic for follow up. Reports he was seen by Ortho and chose conservative management management for shoulder subluxation. No concerns at this time.    Allergies[1]  ROS: Review of Systems Negative except as stated above  PHYSICAL EXAM: BP 136/81   Pulse 63   Temp 98.1 F (36.7 C) (Oral)   Resp 17   Ht 5' 8 (1.727 m)   Wt 202 lb 12.8 oz (92 kg)   SpO2 98%   BMI 30.84 kg/m   Physical Exam  General: well-appearing, no acute distress Skin: no jaundice, rashes, or lesions Cardiovascular: regular heart rate and rhythm, normal S1/S2, no murmurs, gallops, or rubs, peripheral pulses 2+ bilaterally Chest: no skeletal deformity, lungs clear to auscultation bilaterally, equal breath sounds bilaterally Musculoskeletal: normal gait Extremities: no peripheral edema  ASSESSMENT AND PLAN:  1. Anterior subluxation of left shoulder, subsequent encounter (Primary) - Conservative management at this time. No concerns.    Patient was given the opportunity to ask questions.  Patient verbalized understanding of the plan and was able to repeat key elements of the plan.    Return in about 6 months (around 08/08/2024) for physical, labs.  Sula Leavy Rode, PA-C      [1]  Allergies Allergen Reactions   [facility] (Non-Screening) Anaphylaxis   "
